# Patient Record
Sex: Female | Born: 2005 | Race: White | Hispanic: No | Marital: Single | Smoking: Never smoker
Health system: Southern US, Community
[De-identification: ages and names within clinical notes are randomized; demographics above are authoritative.]

---
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
## 2023-03-04 ENCOUNTER — Encounter: Payer: Self-pay | Admitting: Pediatrics
